# Patient Record
Sex: Female | Born: 1990 | Race: Black or African American | Hispanic: No | Marital: Single | Smoking: Never smoker
Health system: Southern US, Community
[De-identification: ages and names within clinical notes are randomized; demographics above are authoritative.]

## PROBLEM LIST (undated history)

## (undated) DIAGNOSIS — Z789 Other specified health status: Secondary | ICD-10-CM

## (undated) DIAGNOSIS — L309 Dermatitis, unspecified: Secondary | ICD-10-CM

## (undated) HISTORY — PX: HERNIA REPAIR: SHX51

## (undated) HISTORY — DX: Dermatitis, unspecified: L30.9

## (undated) HISTORY — PX: TOOTH EXTRACTION: SUR596

## (undated) HISTORY — PX: TONSILLECTOMY AND ADENOIDECTOMY: SUR1326

---

## 2016-02-03 ENCOUNTER — Other Ambulatory Visit (HOSPITAL_COMMUNITY): Payer: Self-pay | Admitting: Unknown Physician Specialty

## 2016-02-03 DIAGNOSIS — Z3689 Encounter for other specified antenatal screening: Secondary | ICD-10-CM

## 2016-02-03 DIAGNOSIS — O4403 Placenta previa specified as without hemorrhage, third trimester: Secondary | ICD-10-CM

## 2016-02-03 DIAGNOSIS — Z3A33 33 weeks gestation of pregnancy: Secondary | ICD-10-CM

## 2016-02-06 ENCOUNTER — Encounter (HOSPITAL_COMMUNITY): Payer: Self-pay | Admitting: *Deleted

## 2016-02-07 ENCOUNTER — Ambulatory Visit (HOSPITAL_COMMUNITY)
Admission: RE | Admit: 2016-02-07 | Discharge: 2016-02-07 | Disposition: A | Discharge status: Home / Self Care | Payer: BLUE CROSS/BLUE SHIELD | Source: Ambulatory Visit | Attending: Unknown Physician Specialty | Admitting: Unknown Physician Specialty

## 2016-02-07 ENCOUNTER — Encounter (HOSPITAL_COMMUNITY): Payer: Self-pay

## 2016-02-07 ENCOUNTER — Other Ambulatory Visit (HOSPITAL_COMMUNITY): Payer: Self-pay | Admitting: Unknown Physician Specialty

## 2016-02-07 DIAGNOSIS — O4420 Partial placenta previa NOS or without hemorrhage, unspecified trimester: Secondary | ICD-10-CM | POA: Diagnosis present

## 2016-02-07 DIAGNOSIS — Z3689 Encounter for other specified antenatal screening: Secondary | ICD-10-CM

## 2016-02-07 DIAGNOSIS — Z3A33 33 weeks gestation of pregnancy: Secondary | ICD-10-CM

## 2016-02-07 DIAGNOSIS — O4403 Placenta previa specified as without hemorrhage, third trimester: Secondary | ICD-10-CM

## 2016-02-07 HISTORY — DX: Other specified health status: Z78.9

## 2016-02-07 NOTE — Progress Notes (Signed)
MATERNAL FETAL MEDICINE CONSULT  Patient Name: Kimberly LairDewanna Logan Medical Record Number:  [id number] Date of Birth: December 11, 1990 Requesting Physician Name:  Ruthy DickWilliam McLeod, MD Date of Service: 02/07/2016  Chief Complaint Posterior low-lying placenta  History of Present Illness Kimberly Logan was seen today secondary to a low-lying placenta at the request of Ruthy DickWilliam McLeod, MD.  The patient is a 25 y.o. G1P0,at 476w5d with an EDD of 03/22/2016, by 13 week ultrasound.  Today's ultrasound confirms that Kimberly Logan has a posterior low lying placenta approximately 1 cm from the internal os.  She has has some intermittent spotting over the past several weeks, but no significant bleeding.  She also has intermittent Braxton-Hicks contractions.  Her fetus is very active.  She has no acute complaints today.  Review of Systems Pertinent items are noted in HPI.  Patient History OB History  Gravida Para Term Preterm AB Living  1            SAB TAB Ectopic Multiple Live Births               # Outcome Date GA Lbr Len/2nd Weight Sex Delivery Anes PTL Lv  1 Current               Past Medical History:  Diagnosis Date  . Medical history non-contributory     Past Surgical History:  Procedure Laterality Date  . HERNIA REPAIR    . TONSILLECTOMY AND ADENOIDECTOMY    . TOOTH EXTRACTION      Social History   Social History  . Marital status: Single    Spouse name: N/A  . Number of children: N/A  . Years of education: N/A   Social History Main Topics  . Smoking status: Never Smoker  . Smokeless tobacco: Never Used  . Alcohol use No  . Drug use: No  . Sexual activity: Not on file   Other Topics Concern  . Not on file   Social History Narrative  . No narrative on file    Family History Kimberly Logan and the father of the baby have no family history of mental retardation, birth defects, or genetic diseases.  Physical Examination Vitals - Pulse 96, BP 133/87, Weight 188.2 lbs General appearance -  alert, well appearing, and in no distress Mental status - alert, oriented to person, place, and time  Assessment and Recommendations 1.  Posterior low-lying placenta.  As Kimberly Logan's placenta is only 1.0 cm from the internal os she will require a cesarean section at approximately 36 weeks if the placenta does not migrate toward the fundus by that time.  Although the risk of that happening is low, it is reasonable to perform another transvaginal ultrasound at 36 weeks to reassess placental location in relation to the cervix.  This [facility] or locally depending on Kimberly Logan's and Dr. Madalyn RobMcLeod's preferences.  If it is greater than 2 cm from the internal os a vaginal delivery can be safely attempted.  If less than 2 cm away she should be delivery via cesarean section.  I spent 15 minutes with Kimberly Logan today of which 50% was face-to-face counseling.  [facility].  Please do not hesitate to contact us with questions.   Rema FendtNITSCHE,Hayla Hinger, MD

## 2016-02-21 ENCOUNTER — Encounter (HOSPITAL_COMMUNITY): Payer: Self-pay

## 2016-12-11 ENCOUNTER — Encounter (HOSPITAL_COMMUNITY): Payer: Self-pay

## 2017-06-01 ENCOUNTER — Encounter: Payer: Self-pay | Admitting: Allergy and Immunology

## 2017-06-01 ENCOUNTER — Ambulatory Visit: Payer: BLUE CROSS/BLUE SHIELD | Admitting: Allergy and Immunology

## 2017-06-01 VITALS — BP 120/92 | HR 76 | Temp 97.5°F | Resp 16 | Ht 65.8 in | Wt 174.6 lb

## 2017-06-01 DIAGNOSIS — H1010 Acute atopic conjunctivitis, unspecified eye: Secondary | ICD-10-CM | POA: Insufficient documentation

## 2017-06-01 DIAGNOSIS — L500 Allergic urticaria: Secondary | ICD-10-CM

## 2017-06-01 DIAGNOSIS — H1013 Acute atopic conjunctivitis, bilateral: Secondary | ICD-10-CM

## 2017-06-01 DIAGNOSIS — L230 Allergic contact dermatitis due to metals: Secondary | ICD-10-CM

## 2017-06-01 DIAGNOSIS — T783XXD Angioneurotic edema, subsequent encounter: Secondary | ICD-10-CM | POA: Diagnosis not present

## 2017-06-01 DIAGNOSIS — T783XXA Angioneurotic edema, initial encounter: Secondary | ICD-10-CM | POA: Insufficient documentation

## 2017-06-01 DIAGNOSIS — J3089 Other allergic rhinitis: Secondary | ICD-10-CM | POA: Diagnosis not present

## 2017-06-01 DIAGNOSIS — R030 Elevated blood-pressure reading, without diagnosis of hypertension: Secondary | ICD-10-CM

## 2017-06-01 DIAGNOSIS — T7800XA Anaphylactic reaction due to unspecified food, initial encounter: Secondary | ICD-10-CM | POA: Insufficient documentation

## 2017-06-01 DIAGNOSIS — T7800XD Anaphylactic reaction due to unspecified food, subsequent encounter: Secondary | ICD-10-CM

## 2017-06-01 MED ORDER — OLOPATADINE HCL 0.7 % OP SOLN: 1.0000 [drp] | OPHTHALMIC | 5 refills | Status: AC

## 2017-06-01 MED ORDER — EPINEPHRINE 0.3 MG/0.3ML IJ SOAJ: 0.3000 mg | Freq: Once | INTRAMUSCULAR | 3 refills | Status: AC

## 2017-06-01 MED ORDER — RANITIDINE HCL 150 MG PO CAPS: 150.0000 mg | ORAL_CAPSULE | Freq: Two times a day (BID) | ORAL | 5 refills | Status: AC

## 2017-06-01 MED ORDER — LEVOCETIRIZINE DIHYDROCHLORIDE 5 MG PO TABS: 5.0000 mg | ORAL_TABLET | Freq: Every evening | ORAL | 5 refills | Status: AC

## 2017-06-01 MED ORDER — AZELASTINE-FLUTICASONE 137-50 MCG/ACT NA SUSP
1.0000 | Freq: Two times a day (BID) | NASAL | 5 refills | Status: AC
Start: 2017-06-01 — End: ?

## 2017-06-01 NOTE — Assessment & Plan Note (Signed)
   Aeroallergen avoidance measures have been discussed and provided in written form.  Levocetirizine has been prescribed (as above).  A prescription has been provided for Dymista (azelastine/fluticasone) nasal spray, 1 spray per nostril twice daily as needed. Proper nasal spray technique has been discussed and demonstrated.  Nasal saline spray (i.e., Simply Saline) or nasal saline lavage (i.e., NeilMed) is recommended as needed and prior to medicated nasal sprays.  If allergen avoidance measures and medications fail to adequately relieve symptoms, aeroallergen immunotherapy will be considered.

## 2017-06-01 NOTE — Assessment & Plan Note (Signed)
Associated angioedema occurs in up to 50% of patients with chronic urticaria.  Treatment/diagnostic plan as outlined above. 

## 2017-06-01 NOTE — Progress Notes (Signed)
New Patient Note  RE: Kimberly Logan MRN: [id number] DOB: [birth year] Date of Office Visit: 06/01/2017  Referring provider: Lavella Lemons, PA Primary care provider: Lavella Lemons, PA  Chief Complaint: Allergic Reaction; Angioedema; and Allergic Rhinitis    History of present illness: Kimberly Logan is a 27 y.o. female seen today in consultation requested by [name], MD.  She is accompanied today by her mother who assists with the history.  She reports that on March 7 she developed hives "all over" her body.  She took diphenhydramine in the hives resolved.  She woke up the next morning with significant lip, hand, and finger swelling.  She reports that shortly prior to the onset of the urticaria she had consumed a strawberry and took the trash out.  She is unaware of any other potential triggers.  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  She does report, however that she has been experiencing recurrence of mild urticaria "on and off" since that time.  No specific medication, food, skin care product, detergent, soap, or other environmental triggers have been identified.  She has not experienced concomitant cardiopulmonary or GI symptoms.  She denies unexpected weight loss, drenching night sweats, and recurrent fevers.  She had a similar episode of urticaria and angioedema when she was 42 or 89 years old.  Her mother believes that this occurred during spring time and  [name] notes that the hives and swelling had occurred after having consumed a granola bar and shrimp.  She reports that she has developed hives on multiple occasions after consuming peanuts or peanut butter.  She currently avoids all nuts and all seafood. When she was 34 or 27 years old, she developed swelling of her gums and lips after having had braces made of nickel put on her teeth.  The Nickel braces were removed and switched out to stainless steel  without further symptoms.  She also develops a red, itchy spot where the snap of her pants makes contact with the skin. She experiences nasal congestion, rhinorrhea, sneezing, postnasal drainage, and occasional sinus pressure.  These symptoms are most frequent and severe during the springtime.  She attempts to control the symptoms with loratadine.  Assessment and plan: Food allergy The patient's history suggests food allergy and positive skin test results today confirm this diagnosis.  Food allergen skin tests were robust positive to tree nuts, borderline positive to fish mix and shellfish mix, and negative to peanut.  A laboratory order form has been provided for serum specific IgE against peanut, peanut components, fish panel, and shellfish panel.  Meticulous avoidance of tree nuts as discussed.  In addition, until allergy to peanut, fish, and shellfish has been ruled out, she will continue avoidance of these foods as well.  A prescription has been provided for epinephrine auto-injector 2 pack along with instructions for proper administration.  A food allergy action plan has been provided and discussed.  Medic Alert identification is recommended.  Recurrent urticaria Unclear etiology. Skin tests to select food allergens were positive to tree nuts and borderline positive to fish mix and shellfish mix, however she does not believe that she any of these foods directly or through cross contamination prior to the onset of symptoms. NSAIDs and emotional stress commonly exacerbate urticaria but are not the underlying etiology in this case. Physical urticarias are negative by history (i.e. pressure-induced, temperature, vibration, solar, etc.). There are no concomitant symptoms concerning  for anaphylaxis or constitutional symptoms worrisome for an underlying malignancy.  Given the history it is possible that the patient's urticaria is secondary to pollen exposure, however to be thorough we will rule out  other potential etiologies with labs. For symptom relief, patient is to take oral antihistamines as directed.  The following labs have been ordered: FCeRI antibody, anti-thyroglobulin antibody, thyroid peroxidase antibody, tryptase, C4, CBC, CMP, ESR, ANA, and galactose-alpha-1,3-galactose IgE level.  The patient will be called with further recommendations after lab results have returned.  Instructions have been discussed and provided for H1/H2 receptor blockade with titration to find lowest effective dose.  A prescription has been provided for levocetirizine, 5 mg daily as needed.  Should there be a significant increase or change in symptoms, a journal is to be kept recording any foods eaten, beverages consumed, medications taken within a 6 hour period prior to the onset of symptoms, as well as record activities being performed, and environmental conditions. For any symptoms concerning for anaphylaxis, epinephrine is to be administered and 911 is to be called immediately.  Angioedema Associated angioedema occurs in up to 50% of patients with chronic urticaria.  Treatment/diagnostic plan as outlined above.  Perennial and seasonal allergic rhinitis  Aeroallergen avoidance measures have been discussed and provided in written form.  Levocetirizine has been prescribed (as above).  A prescription has been provided for Dymista (azelastine/fluticasone) nasal spray, 1 spray per nostril twice daily as needed. Proper nasal spray technique has been discussed and demonstrated.  Nasal saline spray (i.e., Simply Saline) or nasal saline lavage (i.e., NeilMed) is recommended as needed and prior to medicated nasal sprays.  If allergen avoidance measures and medications fail to adequately relieve symptoms, aeroallergen immunotherapy will be considered.  Allergic conjunctivitis  Treatment plan as outlined above for allergic rhinitis.  A prescription has been provided for Pazeo, one drop per eye daily  as needed.  I have also recommended eye lubricant drops (i.e., Natural Tears) as needed.  Elevated blood-pressure reading without diagnosis of hypertension  The patient has been made aware of the elevated blood pressure reading and has been encouraged to follow up with her primary care physician in the near future regarding this issue.  Pammy has verbalized understanding and agreed to do so.   Meds ordered this encounter  Medications  . levocetirizine (XYZAL) 5 MG tablet    Sig: Take 1 tablet (5 mg total) by mouth every evening.    Dispense:  30 tablet    Refill:  5  . Azelastine-Fluticasone 137-50 MCG/ACT SUSP    Sig: Place 1 spray into the nose 2 (two) times daily.    Dispense:  23 g    Refill:  5  . Olopatadine HCl (PAZEO) 0.7 % SOLN    Sig: Place 1 drop into both eyes 1 day or 1 dose.    Dispense:  1 Bottle    Refill:  5  . ranitidine (ZANTAC) 150 MG capsule    Sig: Take 1 capsule (150 mg total) by mouth 2 (two) times daily.    Dispense:  60 capsule    Refill:  5  . EPINEPHrine (AUVI-Q) 0.3 mg/0.3 mL IJ SOAJ injection    Sig: Inject 0.3 mLs (0.3 mg total) into the muscle once for 1 dose.    Dispense:  2 Device    Refill:  3    Diagnostics: Environmental skin testing: Positive to grass pollen, weed pollen, ragweed pollen, tree pollen, molds, cat hair, dog epithelia, cockroach antigen, and  dust mite antigen. Food allergen skin testing: Robust positive to cashew, pecan, walnut, and almond.  Borderline positive to shellfish mix and fish mix.    Physical examination: Blood pressure (!) 120/92, pulse 76, temperature (!) 97.5 F (36.4 C), temperature source Oral, resp. rate 16, height 5' 5.8" (1.671 m), weight 174 lb 9.6 oz (79.2 kg), unknown if currently breastfeeding.  General: Alert, interactive, in no acute distress. HEENT: TMs pearly gray, turbinates edematous with clear discharge, post-pharynx mildly erythematous. Neck: Supple without lymphadenopathy. Lungs: Clear to  auscultation without wheezing, rhonchi or rales. CV: Normal S1, S2 without murmurs. Abdomen: Nondistended, nontender. Skin: Scattered erythematous urticarial type lesions primarily located left forearm , nonvesicular. Extremities:  No clubbing, cyanosis or edema. Neuro:   Grossly intact.  Review of systems:  Review of systems negative except as noted in HPI / PMHx or noted below: Review of Systems  Constitutional: Negative.   HENT: Negative.   Eyes: Negative.   Respiratory: Negative.   Cardiovascular: Negative.   Gastrointestinal: Negative.   Genitourinary: Negative.   Musculoskeletal: Negative.   Skin: Negative.   Neurological: Negative.   Endo/Heme/Allergies: Negative.   Psychiatric/Behavioral: Negative.     Past medical history:  Past Medical History:  Diagnosis Date  . Eczema   . Medical history non-contributory     Past surgical history:  Past Surgical History:  Procedure Laterality Date  . CESAREAN SECTION    . HERNIA REPAIR    . TONSILLECTOMY AND ADENOIDECTOMY    . TOOTH EXTRACTION      Family history: Family History  Problem Relation Age of Onset  . Asthma Mother   . Asthma Maternal Uncle   . Asthma Maternal Grandmother   . Allergic rhinitis Neg Hx   . Angioedema Neg Hx   . Eczema Neg Hx   . Urticaria Neg Hx     Social history: Social History   Socioeconomic History  . Marital status: Single    Spouse name: Not on file  . Number of children: Not on file  . Years of education: Not on file  . Highest education level: Not on file  Occupational History  . Not on file  Social Needs  . Financial resource strain: Not on file  . Food insecurity:    Worry: Not on file    Inability: Not on file  . Transportation needs:    Medical: Not on file    Non-medical: Not on file  Tobacco Use  . Smoking status: Never Smoker  . Smokeless tobacco: Never Used  Substance and Sexual Activity  . Alcohol use: No  . Drug use: No  . Sexual activity: Not on file   Lifestyle  . Physical activity:    Days per week: Not on file    Minutes per session: Not on file  . Stress: Not on file  Relationships  . Social connections:    Talks on phone: Not on file    Gets together: Not on file    Attends religious service: Not on file    Active member of club or organization: Not on file    Attends meetings of clubs or organizations: Not on file    Relationship status: Not on file  . Intimate partner violence:    Fear of current or ex partner: Not on file    Emotionally abused: Not on file    Physically abused: Not on file    Forced sexual activity: Not on file  Other Topics Concern  .  Not on file  Social History Narrative  . Not on file   Environmental History: The patient lives in an 26 year old house with hardwood floors throughout, will heat, and central air.  She is a non-smoker without pets.   Allergies as of 06/01/2017      Reactions   Aspirin    Nickel    Peanut-containing Drug Products    Penicillins    Shellfish Allergy       Medication List        Accurate as of 06/01/17 11:40 AM. Always use your most recent med list.          Azelastine-Fluticasone 137-50 MCG/ACT Susp Place 1 spray into the nose 2 (two) times daily.   EPINEPHrine 0.3 mg/0.3 mL Soaj injection Commonly known as:  AUVI-Q Inject 0.3 mLs (0.3 mg total) into the muscle once for 1 dose.   levocetirizine 5 MG tablet Commonly known as:  XYZAL Take 1 tablet (5 mg total) by mouth every evening.   Olopatadine HCl 0.7 % Soln Commonly known as:  PAZEO Place 1 drop into both eyes 1 day or 1 dose.   ranitidine 150 MG capsule Commonly known as:  ZANTAC Take 1 capsule (150 mg total) by mouth 2 (two) times daily.   TRI-SPRINTEC 0.18/0.215/0.25 MG-35 MCG tablet Generic drug:  Norgestimate-Ethinyl Estradiol Triphasic       Known medication allergies: Allergies  Allergen Reactions  . Aspirin   . Nickel   . Peanut-Containing Drug Products   . Penicillins   .  Shellfish Allergy     I appreciate the opportunity to take part in Jadene's care. Please do not hesitate to contact me with questions.  Sincerely,   R. Edgar Frisk, MD

## 2017-06-01 NOTE — Assessment & Plan Note (Signed)
   The patient has been made aware of the elevated blood pressure reading and has been encouraged to follow up with her primary care physician in the near future regarding this issue.  Kimberly Logan has verbalized understanding and agreed to do so.

## 2017-06-01 NOTE — Assessment & Plan Note (Signed)
   Treatment plan as outlined above for allergic rhinitis.  A prescription has been provided for Pazeo, one drop per eye daily as needed.  I have also recommended eye lubricant drops (i.e., Natural Tears) as needed. 

## 2017-06-01 NOTE — Patient Instructions (Addendum)
Food allergy The patient's history suggests food allergy and positive skin test results today confirm this diagnosis.  Food allergen skin tests were robust positive to tree nuts, borderline positive to fish mix and shellfish mix, and negative to peanut.  A laboratory order form has been provided for serum specific IgE against peanut, peanut components, fish panel, and shellfish panel.  Meticulous avoidance of tree nuts as discussed.  In addition, until allergy to peanut, fish, and shellfish has been ruled out, she will continue avoidance of these foods as well.  A prescription has been provided for epinephrine auto-injector 2 pack along with instructions for proper administration.  A food allergy action plan has been provided and discussed.  Medic Alert identification is recommended.  Recurrent urticaria Unclear etiology. Skin tests to select food allergens were positive to tree nuts and borderline positive to fish mix and shellfish mix, however she does not believe that she any of these foods directly or through cross contamination prior to the onset of symptoms. NSAIDs and emotional stress commonly exacerbate urticaria but are not the underlying etiology in this case. Physical urticarias are negative by history (i.e. pressure-induced, temperature, vibration, solar, etc.). There are no concomitant symptoms concerning for anaphylaxis or constitutional symptoms worrisome for an underlying malignancy.  Given the history it is possible that the patient's urticaria is secondary to pollen exposure, however to be thorough we will rule out other potential etiologies with labs. For symptom relief, patient is to take oral antihistamines as directed.  The following labs have been ordered: FCeRI antibody, anti-thyroglobulin antibody, thyroid peroxidase antibody, tryptase, C4, CBC, CMP, ESR, ANA, and galactose-alpha-1,3-galactose IgE level.  The patient will be called with further recommendations after lab  results have returned.  Instructions have been discussed and provided for H1/H2 receptor blockade with titration to find lowest effective dose.  A prescription has been provided for levocetirizine, 5 mg daily as needed.  Should there be a significant increase or change in symptoms, a journal is to be kept recording any foods eaten, beverages consumed, medications taken within a 6 hour period prior to the onset of symptoms, as well as record activities being performed, and environmental conditions. For any symptoms concerning for anaphylaxis, epinephrine is to be administered and 911 is to be called immediately.  Angioedema Associated angioedema occurs in up to 50% of patients with chronic urticaria.  Treatment/diagnostic plan as outlined above.  Perennial and seasonal allergic rhinitis  Aeroallergen avoidance measures have been discussed and provided in written form.  Levocetirizine has been prescribed (as above).  A prescription has been provided for Dymista (azelastine/fluticasone) nasal spray, 1 spray per nostril twice daily as needed. Proper nasal spray technique has been discussed and demonstrated.  Nasal saline spray (i.e., Simply Saline) or nasal saline lavage (i.e., NeilMed) is recommended as needed and prior to medicated nasal sprays.  If allergen avoidance measures and medications fail to adequately relieve symptoms, aeroallergen immunotherapy will be considered.  Allergic conjunctivitis  Treatment plan as outlined above for allergic rhinitis.  A prescription has been provided for Pazeo, one drop per eye daily as needed.  I have also recommended eye lubricant drops (i.e., Natural Tears) as needed.  Elevated blood-pressure reading without diagnosis of hypertension  The patient has been made aware of the elevated blood pressure reading and has been encouraged to follow up with her primary care physician in the near future regarding this issue.  Desteny has verbalized  understanding and agreed to do so.   When lab results  have returned the patient will be called with further recommendations and follow up instructions.  Urticaria (Hives)  . Levocetirizine (Xyzal) 5 mg twice a day and ranitidine (Zantac) 150 mg twice a day. If no symptoms for 7-14 days then decrease to. . Levocetirizine (Xyzal) 5 mg twice a day and ranitidine (Zantac) 150 mg once a day.  If no symptoms for 7-14 days then decrease to. . Levocetirizine (Xyzal) 5 mg twice a day.  If no symptoms for 7-14 days then decrease to. . Levocetirizine (Xyzal) 5 mg once a day.  May use Benadryl (diphenhydramine) as needed for breakthrough symptoms       If symptoms return, then step up dosage   Reducing Pollen Exposure  The American Academy of Allergy, Asthma and Immunology suggests the following steps to reduce your exposure to pollen during allergy seasons.    1. Do not hang sheets or clothing out to dry; pollen may collect on these items. 2. Do not mow lawns or spend time around freshly cut grass; mowing stirs up pollen. 3. Keep windows closed at night.  Keep car windows closed while driving. 4. Minimize morning activities outdoors, a time when pollen counts are usually at their highest. 5. Stay indoors as much as possible when pollen counts or humidity is high and on windy days when pollen tends to remain in the air longer. 6. Use air conditioning when possible.  Many air conditioners have filters that trap the pollen spores. 7. Use a HEPA room air filter to remove pollen form the indoor air you breathe.   Control of House Dust Mite Allergen  House dust mites play a major role in allergic asthma and rhinitis.  They occur in environments with high humidity wherever human skin, the food for dust mites is found. High levels have been detected in dust obtained from mattresses, pillows, carpets, upholstered furniture, bed covers, clothes and soft toys.  The principal allergen of the house dust mite  is found in its feces.  A gram of dust may contain 1,000 mites and 250,000 fecal particles.  Mite antigen is easily measured in the air during house cleaning activities.    1. Encase mattresses, including the box spring, and pillow, in an air tight cover.  Seal the zipper end of the encased mattresses with wide adhesive tape. 2. Wash the bedding in water of 130 degrees Farenheit weekly.  Avoid cotton comforters/quilts and flannel bedding: the most ideal bed covering is the dacron comforter. 3. Remove all upholstered furniture from the bedroom. 4. Remove carpets, carpet padding, rugs, and non-washable window drapes from the bedroom.  Wash drapes weekly or use plastic window coverings. 5. Remove all non-washable stuffed toys from the bedroom.  Wash stuffed toys weekly. 6. Have the room cleaned frequently with a vacuum cleaner and a damp dust-mop.  The patient should not be in a room which is being cleaned and should wait 1 hour after cleaning before going into the room. 7. Close and seal all heating outlets in the bedroom.  Otherwise, the room will become filled with dust-laden air.  An electric heater can be used to heat the room. Reduce indoor humidity to less than 50%.  Do not use a humidifier.  Control of Dog or Cat Allergen  Avoidance is the best way to manage a dog or cat allergy. If you have a dog or cat and are allergic to dog or cats, consider removing the dog or cat from the home. If you have a dog  or cat but don't want to find it a new home, or if your family wants a pet even though someone in the household is allergic, here are some strategies that may help keep symptoms at bay:  1. Keep the pet out of your bedroom and restrict it to only a few rooms. Be advised that keeping the dog or cat in only one room will not limit the allergens to that room. 2. Don't pet, hug or kiss the dog or cat; if you do, wash your hands with soap and water. 3. High-efficiency particulate air (HEPA) cleaners  run continuously in a bedroom or living room can reduce allergen levels over time. 4. Place electrostatic material sheet in the air inlet vent in the bedroom. 5. Regular use of a high-efficiency vacuum cleaner or a central vacuum can reduce allergen levels. 6. Giving your dog or cat a bath at least once a week can reduce airborne allergen.  Control of Mold Allergen  Mold and fungi can grow on a variety of surfaces provided certain temperature and moisture conditions exist.  Outdoor molds grow on plants, decaying vegetation and soil.  The major outdoor mold, Alternaria and Cladosporium, are found in very high numbers during hot and dry conditions.  Generally, a late Summer - Fall peak is seen for common outdoor fungal spores.  Rain will temporarily lower outdoor mold spore count, but counts rise rapidly when the rainy period ends.  The most important indoor molds are Aspergillus and Penicillium.  Dark, humid and poorly ventilated basements are ideal sites for mold growth.  The next most common sites of mold growth are the bathroom and the kitchen.  Outdoor [employer] 1. Use air conditioning and keep windows closed 2. Avoid exposure to decaying vegetation. 3. Avoid leaf raking. 4. Avoid grain handling. 5. Consider wearing a face mask if working in moldy areas.  Indoor Mold Control 1. Maintain humidity below 50%. 2. Clean washable surfaces with 5% bleach solution. 3. Remove sources e.g. Contaminated carpets.  Control of Cockroach Allergen  Cockroach allergen has been identified as an important cause of acute attacks of asthma, especially in urban settings.  There are fifty-five species of cockroach that exist in the [place], however only three, the [place], [place] species produce allergen that can affect patients with Asthma.  Allergens can be obtained from fecal particles, egg casings and secretions from cockroaches.    1. Remove food sources. 2. Reduce access to  water. 3. Seal access and entry points. 4. Spray runways with 0.5-1% Diazinon or Chlorpyrifos 5. Blow boric acid power under stoves and refrigerator. 6. Place bait stations (hydramethylnon) at feeding sites.

## 2017-06-01 NOTE — Assessment & Plan Note (Signed)
The patient's history suggests food allergy and positive skin test results today confirm this diagnosis.  Food allergen skin tests were robust positive to tree nuts, borderline positive to fish mix and shellfish mix, and negative to peanut.  A laboratory order form has been provided for serum specific IgE against peanut, peanut components, fish panel, and shellfish panel.  Meticulous avoidance of tree nuts as discussed.  In addition, until allergy to peanut, fish, and shellfish has been ruled out, she will continue avoidance of these foods as well.  A prescription has been provided for epinephrine auto-injector 2 pack along with instructions for proper administration.  A food allergy action plan has been provided and discussed.  Medic Alert identification is recommended.

## 2017-06-01 NOTE — Assessment & Plan Note (Addendum)
Unclear etiology. Skin tests to select food allergens were positive to tree nuts and borderline positive to fish mix and shellfish mix, however she does not believe that she any of these foods directly or through cross contamination prior to the onset of symptoms. NSAIDs and emotional stress commonly exacerbate urticaria but are not the underlying etiology in this case. Physical urticarias are negative by history (i.e. pressure-induced, temperature, vibration, solar, etc.). There are no concomitant symptoms concerning for anaphylaxis or constitutional symptoms worrisome for an underlying malignancy.  Given the history it is possible that the patient's urticaria is secondary to pollen exposure, however to be thorough we will rule out other potential etiologies with labs. For symptom relief, patient is to take oral antihistamines as directed.  The following labs have been ordered: FCeRI antibody, anti-thyroglobulin antibody, thyroid peroxidase antibody, tryptase, C4, CBC, CMP, ESR, ANA, and galactose-alpha-1,3-galactose IgE level.  The patient will be called with further recommendations after lab results have returned.  Instructions have been discussed and provided for H1/H2 receptor blockade with titration to find lowest effective dose.  A prescription has been provided for levocetirizine, 5 mg daily as needed.  Should there be a significant increase or change in symptoms, a journal is to be kept recording any foods eaten, beverages consumed, medications taken within a 6 hour period prior to the onset of symptoms, as well as record activities being performed, and environmental conditions. For any symptoms concerning for anaphylaxis, epinephrine is to be administered and 911 is to be called immediately.

## 2017-06-10 LAB — COMPREHENSIVE METABOLIC PANEL
ALT: 12 IU/L (ref 0–32)
AST: 14 IU/L (ref 0–40)
Albumin/Globulin Ratio: 1.3 (ref 1.2–2.2)
Albumin: 4.3 g/dL (ref 3.5–5.5)
Alkaline Phosphatase: 67 IU/L (ref 39–117)
BUN/Creatinine Ratio: 16 (ref 9–23)
BUN: 10 mg/dL (ref 6–20)
Bilirubin Total: 0.4 mg/dL (ref 0.0–1.2)
CO2: 22 mmol/L (ref 20–29)
Calcium: 9.1 mg/dL (ref 8.7–10.2)
Chloride: 104 mmol/L (ref 96–106)
Creatinine, Ser: 0.61 mg/dL (ref 0.57–1.00)
GFR calc Af Amer: 146 mL/min/{1.73_m2} (ref >59)
GFR calc non Af Amer: 126 mL/min/{1.73_m2} (ref >59)
Globulin, Total: 3.2 g/dL (ref 1.5–4.5)
Glucose: 83 mg/dL (ref 65–99)
Potassium: 4.4 mmol/L (ref 3.5–5.2)
Sodium: 139 mmol/L (ref 134–144)
Total Protein: 7.5 g/dL (ref 6.0–8.5)

## 2017-06-10 LAB — SEDIMENTATION RATE: Sed Rate: 9 mm/hr (ref 0–32)

## 2017-06-10 LAB — ALLERGEN PROFILE, SHELLFISH
Clam IgE: <0.1 kU/L
F023-IgE Crab: <0.1 kU/L
F080-IgE Lobster: <0.1 kU/L
F290-IgE Oyster: <0.1 kU/L
Scallop IgE: <0.1 kU/L
Shrimp IgE: <0.1 kU/L

## 2017-06-10 LAB — CBC WITH DIFFERENTIAL/PLATELET
Basophils Absolute: 0.1 10*3/uL (ref 0.0–0.2)
Basos: 1 %
EOS (ABSOLUTE): 0.2 10*3/uL (ref 0.0–0.4)
Eos: 4 %
Hematocrit: 40.7 % (ref 34.0–46.6)
Hemoglobin: 13.3 g/dL (ref 11.1–15.9)
Immature Grans (Abs): 0 10*3/uL (ref 0.0–0.1)
Immature Granulocytes: 0 %
Lymphocytes Absolute: 2.4 10*3/uL (ref 0.7–3.1)
Lymphs: 39 %
MCH: 28.7 pg (ref 26.6–33.0)
MCHC: 32.7 g/dL (ref 31.5–35.7)
MCV: 88 fL (ref 79–97)
Monocytes Absolute: 0.4 10*3/uL (ref 0.1–0.9)
Monocytes: 6 %
Neutrophils Absolute: 3.1 10*3/uL (ref 1.4–7.0)
Neutrophils: 50 %
Platelets: 372 10*3/uL (ref 150–379)
RBC: 4.64 x10E6/uL (ref 3.77–5.28)
RDW: 14.9 % (ref 12.3–15.4)
WBC: 6.2 10*3/uL (ref 3.4–10.8)

## 2017-06-10 LAB — THYROGLOBULIN LEVEL: Thyroglobulin (TG-RIA): 82 ng/mL — ABNORMAL HIGH

## 2017-06-10 LAB — IGE PEANUT COMPONENT PROFILE
F352-IgE Ara h 8: <0.1 kU/L
F422-IgE Ara h 1: <0.1 kU/L
F423-IgE Ara h 2: <0.1 kU/L
F424-IgE Ara h 3: <0.1 kU/L
F427-IgE Ara h 9: <0.1 kU/L
F447-IgE Ara h 6: <0.1 kU/L

## 2017-06-10 LAB — ALPHA-GAL PANEL
Alpha Gal IgE*: <0.1 kU/L (ref <0.10)
Beef (Bos spp) IgE: <0.1 kU/L (ref <0.35)
Class Interpretation: 0
Class Interpretation: 0
Class Interpretation: 0
Lamb/Mutton (Ovis spp) IgE: <0.1 kU/L (ref <0.35)
Pork (Sus spp) IgE: <0.1 kU/L (ref <0.35)

## 2017-06-10 LAB — C4 COMPLEMENT: Complement C4, Serum: 32 mg/dL (ref 14–44)

## 2017-06-10 LAB — ALLERGEN PROFILE, FOOD-FISH
Allergen Mackerel IgE: <0.1 kU/L
Allergen Salmon IgE: <0.1 kU/L
Allergen Trout IgE: <0.1 kU/L
Allergen Walley Pike IgE: <0.1 kU/L
Codfish IgE: <0.1 kU/L
Halibut IgE: <0.1 kU/L
Tuna: <0.1 kU/L

## 2017-06-10 LAB — TRYPTASE: Tryptase: 5 ug/L (ref 2.2–13.2)

## 2017-06-10 LAB — CHRONIC URTICARIA: cu index: 6.3 (ref <10)

## 2017-06-10 LAB — ALLERGEN, PEANUT F13: Peanut IgE: <0.1 kU/L

## 2017-06-10 LAB — ANA W/REFLEX IF POSITIVE: Anti Nuclear Antibody(ANA): NEGATIVE

## 2017-06-10 LAB — THYROID PEROXIDASE ANTIBODY: Thyroperoxidase Ab SerPl-aCnc: >600 IU/mL — ABNORMAL HIGH (ref 0–34)

## 2017-07-22 ENCOUNTER — Other Ambulatory Visit: Payer: Self-pay

## 2017-07-22 ENCOUNTER — Telehealth: Payer: Self-pay

## 2017-07-22 MED ORDER — MONTELUKAST SODIUM 10 MG PO TABS: 10.0000 mg | ORAL_TABLET | Freq: Every day | ORAL | 5 refills | Status: AC

## 2017-07-22 NOTE — Telephone Encounter (Signed)
Add montelukast 10 mg daily. Continue H1/H2 receptor blockade, titrating to the lowest effective dose necessary to suppress urticaria.

## 2017-07-22 NOTE — Telephone Encounter (Signed)
Left pt a message stating we added Montelukast 10 mg to medications and to continue H1/H2 receptor blockade, titrating to the lowest effective dose necessary to suppress hives

## 2017-07-22 NOTE — Telephone Encounter (Signed)
Dr. Bobbitt can you please advise and thank you 

## 2017-07-22 NOTE — Telephone Encounter (Signed)
Patient is breaking out on her legs and feet. It has been going on for about 2-3 weeks. She is doing all the steps Dr Nunzio CobbsBobbitt instructed her to do when she could break out on her arms.    Please advise   [facility]

## 2018-07-13 ENCOUNTER — Other Ambulatory Visit: Payer: Self-pay

## 2018-07-13 MED ORDER — EPINEPHRINE 0.3 MG/0.3ML IJ SOAJ: 0.3000 mg | INTRAMUSCULAR | 1 refills | Status: AC | PRN
